# Patient Record
Sex: Male | Born: 1985 | Race: Black or African American | Hispanic: No | Smoking: Current every day smoker
Health system: Southern US, Community
[De-identification: ages and names within clinical notes are randomized; demographics above are authoritative.]

## PROBLEM LIST (undated history)

## (undated) DIAGNOSIS — F32A Depression, unspecified: Secondary | ICD-10-CM

## (undated) DIAGNOSIS — F329 Major depressive disorder, single episode, unspecified: Secondary | ICD-10-CM

## (undated) DIAGNOSIS — F419 Anxiety disorder, unspecified: Secondary | ICD-10-CM

---

## 2015-05-19 ENCOUNTER — Emergency Department (HOSPITAL_COMMUNITY)
Admission: EM | Admit: 2015-05-19 | Discharge: 2015-05-19 | Disposition: A | Discharge status: Other Acute Inpatient Hospital | Payer: Self-pay | Attending: Emergency Medicine | Admitting: Emergency Medicine

## 2015-05-19 ENCOUNTER — Inpatient Hospital Stay (HOSPITAL_COMMUNITY)
Admission: AD | Admit: 2015-05-19 | Discharge: 2015-05-21 | DRG: 885 | Disposition: A | Discharge status: Home / Self Care | Payer: No Typology Code available for payment source | Source: Intra-hospital | Attending: Psychiatry | Admitting: Psychiatry

## 2015-05-19 ENCOUNTER — Encounter (HOSPITAL_COMMUNITY): Payer: Self-pay

## 2015-05-19 ENCOUNTER — Encounter (HOSPITAL_COMMUNITY): Payer: Self-pay | Admitting: *Deleted

## 2015-05-19 DIAGNOSIS — G4700 Insomnia, unspecified: Secondary | ICD-10-CM | POA: Diagnosis present

## 2015-05-19 DIAGNOSIS — F1210 Cannabis abuse, uncomplicated: Secondary | ICD-10-CM | POA: Diagnosis present

## 2015-05-19 DIAGNOSIS — F17210 Nicotine dependence, cigarettes, uncomplicated: Secondary | ICD-10-CM | POA: Diagnosis present

## 2015-05-19 DIAGNOSIS — F411 Generalized anxiety disorder: Secondary | ICD-10-CM | POA: Diagnosis present

## 2015-05-19 DIAGNOSIS — R451 Restlessness and agitation: Secondary | ICD-10-CM | POA: Insufficient documentation

## 2015-05-19 DIAGNOSIS — F331 Major depressive disorder, recurrent, moderate: Secondary | ICD-10-CM | POA: Diagnosis not present

## 2015-05-19 DIAGNOSIS — F918 Other conduct disorders: Secondary | ICD-10-CM | POA: Insufficient documentation

## 2015-05-19 DIAGNOSIS — F29 Unspecified psychosis not due to a substance or known physiological condition: Secondary | ICD-10-CM | POA: Insufficient documentation

## 2015-05-19 LAB — COMPREHENSIVE METABOLIC PANEL
ALBUMIN: 4.7 g/dL (ref 3.5–5.0)
ALT: 26 U/L (ref 17–63)
ANION GAP: 14 (ref 5–15)
AST: 27 U/L (ref 15–41)
Alkaline Phosphatase: 50 U/L (ref 38–126)
BILIRUBIN TOTAL: 2 mg/dL — AB (ref 0.3–1.2)
BUN: 9 mg/dL (ref 6–20)
CO2: 22 mmol/L (ref 22–32)
Calcium: 9.2 mg/dL (ref 8.9–10.3)
Chloride: 102 mmol/L (ref 101–111)
Creatinine, Ser: 0.98 mg/dL (ref 0.61–1.24)
GFR calc Af Amer: >60 mL/min (ref >60)
GFR calc non Af Amer: >60 mL/min (ref >60)
GLUCOSE: 91 mg/dL (ref 65–99)
POTASSIUM: 3.3 mmol/L — AB (ref 3.5–5.1)
Sodium: 138 mmol/L (ref 135–145)
TOTAL PROTEIN: 7.6 g/dL (ref 6.5–8.1)

## 2015-05-19 LAB — I-STAT CHEM 8, ED
BUN: 8 mg/dL (ref 6–20)
CHLORIDE: 100 mmol/L — AB (ref 101–111)
CREATININE: 1 mg/dL (ref 0.61–1.24)
Calcium, Ion: 1.12 mmol/L (ref 1.12–1.23)
Glucose, Bld: 92 mg/dL (ref 65–99)
HEMATOCRIT: 51 % (ref 39.0–52.0)
Hemoglobin: 17.3 g/dL — ABNORMAL HIGH (ref 13.0–17.0)
POTASSIUM: 3.3 mmol/L — AB (ref 3.5–5.1)
Sodium: 140 mmol/L (ref 135–145)
TCO2: 23 mmol/L (ref 0–100)

## 2015-05-19 LAB — CBC WITH DIFFERENTIAL/PLATELET
BASOS PCT: 0 %
Basophils Absolute: 0 10*3/uL (ref 0.0–0.1)
EOS ABS: 0.1 10*3/uL (ref 0.0–0.7)
Eosinophils Relative: 2 %
HEMATOCRIT: 46 % (ref 39.0–52.0)
Hemoglobin: 15.3 g/dL (ref 13.0–17.0)
Lymphocytes Relative: 33 %
Lymphs Abs: 0.8 10*3/uL (ref 0.7–4.0)
MCH: 31 pg (ref 26.0–34.0)
MCHC: 33.3 g/dL (ref 30.0–36.0)
MCV: 93.3 fL (ref 78.0–100.0)
MONO ABS: 0.3 10*3/uL (ref 0.1–1.0)
MONOS PCT: 10 %
NEUTROS ABS: 1.4 10*3/uL — AB (ref 1.7–7.7)
Neutrophils Relative %: 55 %
Platelets: 188 10*3/uL (ref 150–400)
RBC: 4.93 MIL/uL (ref 4.22–5.81)
RDW: 11.4 % — AB (ref 11.5–15.5)
WBC: 2.5 10*3/uL — ABNORMAL LOW (ref 4.0–10.5)

## 2015-05-19 LAB — RAPID URINE DRUG SCREEN, HOSP PERFORMED
Amphetamines: NOT DETECTED
BARBITURATES: NOT DETECTED
BENZODIAZEPINES: NOT DETECTED
COCAINE: NOT DETECTED
OPIATES: NOT DETECTED
Tetrahydrocannabinol: POSITIVE — AB

## 2015-05-19 LAB — SALICYLATE LEVEL: Salicylate Lvl: <4 mg/dL (ref 2.8–30.0)

## 2015-05-19 LAB — ETHANOL: ALCOHOL ETHYL (B): 74 mg/dL — AB (ref <5)

## 2015-05-19 LAB — ACETAMINOPHEN LEVEL: Acetaminophen (Tylenol), Serum: <10 ug/mL — ABNORMAL LOW (ref 10–30)

## 2015-05-19 MED ORDER — AMMONIA AROMATIC IN INHA
RESPIRATORY_TRACT | Status: AC
  Filled 2015-05-19: qty 10

## 2015-05-19 MED ORDER — POTASSIUM CHLORIDE CRYS ER 10 MEQ PO TBCR
10.0000 meq | EXTENDED_RELEASE_TABLET | Freq: Two times a day (BID) | ORAL | Status: DC
  Administered 2015-05-20 – 2015-05-21 (×3): 10 meq via ORAL
  Filled 2015-05-19 (×9): qty 1

## 2015-05-19 MED ORDER — ALUM & MAG HYDROXIDE-SIMETH 200-200-20 MG/5ML PO SUSP: 30.0000 mL | ORAL | Status: DC | PRN

## 2015-05-19 MED ORDER — HYDROXYZINE HCL 25 MG PO TABS: 25.0000 mg | ORAL_TABLET | Freq: Three times a day (TID) | ORAL | Status: DC | PRN

## 2015-05-19 MED ORDER — ZIPRASIDONE MESYLATE 20 MG IM SOLR
20.0000 mg | Freq: Once | INTRAMUSCULAR | Status: AC
  Administered 2015-05-19: 20 mg via INTRAMUSCULAR
  Filled 2015-05-19: qty 20

## 2015-05-19 MED ORDER — SODIUM CHLORIDE 0.9 % IV BOLUS (SEPSIS): 1000.0000 mL | Freq: Once | INTRAVENOUS | Status: DC

## 2015-05-19 MED ORDER — NICOTINE 21 MG/24HR TD PT24
21.0000 mg | MEDICATED_PATCH | Freq: Every day | TRANSDERMAL | Status: DC
  Administered 2015-05-19 – 2015-05-21 (×3): 21 mg via TRANSDERMAL
  Filled 2015-05-19 (×6): qty 1

## 2015-05-19 MED ORDER — STERILE WATER FOR INJECTION IJ SOLN
INTRAMUSCULAR | Status: AC
  Administered 2015-05-19: 10 mL
  Filled 2015-05-19: qty 10

## 2015-05-19 MED ORDER — ACETAMINOPHEN 325 MG PO TABS: 650.0000 mg | ORAL_TABLET | Freq: Four times a day (QID) | ORAL | Status: DC | PRN

## 2015-05-19 MED ORDER — TRAZODONE HCL 50 MG PO TABS: 50.0000 mg | ORAL_TABLET | Freq: Every evening | ORAL | Status: DC | PRN

## 2015-05-19 MED ORDER — LORAZEPAM 2 MG/ML IJ SOLN
2.0000 mg | Freq: Once | INTRAMUSCULAR | Status: AC
  Administered 2015-05-19: 2 mg via INTRAMUSCULAR
  Filled 2015-05-19: qty 1

## 2015-05-19 MED ORDER — MAGNESIUM HYDROXIDE 400 MG/5ML PO SUSP: 30.0000 mL | Freq: Every day | ORAL | Status: DC | PRN

## 2015-05-19 NOTE — ED Notes (Signed)
Pt's visa, MC, [employer], ebt, and DL cards, 2 black phones and white ring placed in valuables envelope and given to security.  Key taped to yellow sheet and placed with pt's IVC papers.

## 2015-05-19 NOTE — ED Notes (Signed)
Pt is ambulatory in the room without difficulty.  Pt is alert and oriented x 4.

## 2015-05-19 NOTE — ED Notes (Signed)
Patient belongings are clothing and shoes in 1 patient belonging bag. Valuables have been locked up with security. Patient beloning bag is at nursing station across from rm 15. Patient and belongings have been wanded by security.

## 2015-05-19 NOTE — ED Notes (Addendum)
Pt reported to be laying in the road in front of [employer], when police approached he stood with hands in his pockets and refused to turn around, pt then stated he was suicidal and wanted the police to shoot him.  Pt repeatedly saying he does not like white people and he wants a black nurse. Pt cursing and yelling at staff refusing to answer any questions

## 2015-05-19 NOTE — Tx Team (Signed)
Initial Interdisciplinary Treatment Plan   PATIENT STRESSORS: Financial difficulties Substance abuse   PATIENT STRENGTHS: Average or above average intelligence General fund of knowledge Physical Health   PROBLEM LIST: Problem List/Patient Goals Date to be addressed Date deferred Reason deferred Estimated date of resolution  "I don't want to be here, I want to live" 05/19/2015     Suicidal ideation 05/19/2015     depression 05/19/2015                                          DISCHARGE CRITERIA:  Ability to meet basic life and health needs Improved stabilization in mood, thinking, and/or behavior Need for constant or close observation no longer present  PRELIMINARY DISCHARGE PLAN: Outpatient therapy Return to previous living arrangement  PATIENT/FAMIILY INVOLVEMENT: This treatment plan has been presented to and reviewed with the patient, Stephen Lowe, The patient and family have been given the opportunity to ask questions and make suggestions.  JEHU-APPIAH, Emileigh Kellett K 05/19/2015, 10:02 PM

## 2015-05-19 NOTE — ED Provider Notes (Signed)
Pt sleeping but arousable Awaiting evaluation Mild hypotension noted but otherwise stable   Zadie Rhine, MD 05/19/15 [phone]

## 2015-05-19 NOTE — BHH Counselor (Signed)
Patient has been accepted to [facility] 407-1 per Gretta Arab, RN, [facility]. Informed patients nurse of acceptance.   Davina Poke, LCSW Therapeutic Triage Specialist [facility] 05/19/2015 7:20 PM

## 2015-05-19 NOTE — ED Notes (Addendum)
Pt care assumed, pt resting comfortably.   Sitter at bedside.

## 2015-05-19 NOTE — ED Notes (Signed)
Pt's fiance reports that she has never seen him like this before.  She reports that they are staying at the shelter and that he could not get back in until 11:00p and that he went to the bar around 9:00:P.  She reports that he had been texting her while at the bar and everything seemed to be all right. She also reports that he is not a heavy drinker.

## 2015-05-19 NOTE — ED Notes (Addendum)
Telepsych done

## 2015-05-19 NOTE — ED Provider Notes (Signed)
Pt resting comfortably after given medications BP 101/72 mmHg  Pulse 80  Temp(Src) 97.8 F (36.6 C) (Oral)  Resp 16  SpO2 98% Awaiting evaluation Labs pending at this time   Zadie Rhine, MD 05/19/15 [phone]

## 2015-05-19 NOTE — ED Notes (Signed)
Pt up to the desk, ambulatory w/o difficulty, reporting that he wants to go home.  Pt is aware that he is under IVC and can not leave at this time.

## 2015-05-19 NOTE — ED Notes (Signed)
Report received from Janie Rambo RN. Pt. Alert and oriented in no distress denies SI, HI, AVH and pain.  Pt. Instructed to come to me with problems or concerns.Will continue to monitor for safety via security cameras and Q 15 minute checks. 

## 2015-05-19 NOTE — ED Provider Notes (Addendum)
CSN: [id number]     Arrival date & time 05/19/15  4098 History   First MD Initiated Contact with Patient 05/19/15 0559     Chief Complaint  Patient presents with  . Suicidal     (Consider location/radiation/quality/duration/timing/severity/associated sxs/prior Treatment) Patient is a 30 y.o. male presenting with mental health disorder. The history is provided by the police. The history is limited by the condition of the patient.  Mental Health Problem Presenting symptoms: aggressive behavior   Presenting symptoms: no paranoid behavior   Presenting symptoms comment:  Laid down in the street and wanted the police to shoot him Patient accompanied by:  Law enforcement Degree of incapacity (severity):  Severe Onset quality:  Unable to specify Timing:  Unable to specify Progression:  Unable to specify Context: alcohol use   Treatment compliance:  Untreated Relieved by:  Nothing Worsened by:  Nothing tried Ineffective treatments:  None tried Associated symptoms: no abdominal pain   Risk factors comment:  Unable   No past medical history on file. No past surgical history on file. No family history on file. Social History  Substance Use Topics  . Smoking status: Not on file  . Smokeless tobacco: Not on file  . Alcohol Use: Not on file    Review of Systems  Unable to perform ROS: Psychiatric disorder  Gastrointestinal: Negative for abdominal pain.  Psychiatric/Behavioral: Negative for paranoia.      Allergies  Review of patient's allergies indicates not on file.  Home Medications   Prior to Admission medications   Not on File   BP 126/91 mmHg  Pulse 98  Temp(Src) 97.8 F (36.6 C) (Oral)  Resp 20  SpO2 100% Physical Exam  Constitutional: He appears well-developed and well-nourished.  HENT:  Head: Normocephalic and atraumatic.  Mouth/Throat: Oropharynx is clear and moist. No oropharyngeal exudate.  Eyes: Pupils are equal, round, and reactive to light.  Injected  conjunctiva  Neck: Normal range of motion. Neck supple.  Cardiovascular: Normal rate and regular rhythm.   Pulmonary/Chest: Effort normal and breath sounds normal. No respiratory distress. He has no wheezes. He has no rales.  Abdominal: Soft. Bowel sounds are normal. There is no tenderness. There is no rebound and no guarding.  Musculoskeletal: Normal range of motion.  Neurological: He is alert. He has normal reflexes.  Skin: Skin is warm and dry.  Psychiatric: His affect is angry. His speech is rapid and/or pressured. He is agitated and aggressive.    ED Course  Procedures (including critical care time) Labs Review Labs Reviewed  CBC WITH DIFFERENTIAL/PLATELET  COMPREHENSIVE METABOLIC PANEL  ETHANOL  URINE RAPID DRUG SCREEN, HOSP PERFORMED  ACETAMINOPHEN LEVEL  SALICYLATE LEVEL  I-STAT CHEM 8, ED    Imaging Review No results found. I have personally reviewed and evaluated these images and lab results as part of my medical decision-making.   EKG Interpretation   Date/Time:  Sunday May 19 2015 06:53:12 EST Ventricular Rate:  85 PR Interval:  168 QRS Duration: 98 QT Interval:  366 QTC Calculation: 435 R Axis:   -6 Text Interpretation:  Sinus rhythm Confirmed by [facility]  MD, Tarshia Kot  ([id number]) on 05/19/2015 7:10:24 AM      MDM   Final diagnoses:  None   IVC by police   Signed out to [facility] pending results of labs  Medications  ziprasidone (GEODON) injection 20 mg (20 mg Intramuscular Given 05/19/15 0618)  sterile water (preservative free) injection (10 mLs  Given 05/19/15 0618)  LORazepam (ATIVAN)  injection 2 mg (2 mg Intramuscular Given 05/19/15 0629)   Will need dispo by TTS when awake, as under IVC by police    Tyjay Galindo, MD 05/19/15 0724  Annabeth Tortora, MD 05/19/15 [phone]

## 2015-05-19 NOTE — BH Assessment (Addendum)
Tele Assessment Note   Stephen Lowe is an 30 y.o. male. Pt presents under IVC after lying in road in front of [employer] and telling cop to shoot him. Pt was sleeping and was woken for teleassessment. He is cooperative and oriented x 4. Pt denies SI. He says that he fell down into the road early this am and then decided to tie his shoes. Pt says while tying his shoe, a cop came up and started hassling him. Pt reports cop pulled a gun, so pt says he told cop "to kill me. I want to die anyway." Pt denies hx of suicide attempts. He reports two prior inpatient psychiatric admissions at [facility] in [place] for "depression and stress." Pt currently denies depressive symptoms. He said he had just been kicked out of [employer] for spilling a beverage on his clothes. Pt sts he was at a bar last night celebrating that his fiancee said "yes" when he proposed. He denies HI and denies [facility]. No delusions noted. 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 Pt's BAL was 74. He reports he drank four mixed drinks and beer. Pt says he rarely drinks alcohol or smokes marijuana. His last use of marijuana was 05/12/15 when he "just hit it a couple of times." He denies depressive symptoms. Pt denies access to weapons. He says he has had outpatient MH treatment in the past, but he doesn't remember where or when. Pt sts he moved to [place] a few months ago.   Diagnosis: Major Depressive Disorder, Recurrent, Moderate  Past Medical History: History reviewed. No pertinent past medical history.  History reviewed. No pertinent past surgical history.  Family History: No family history on file.  Social History:  reports that he drinks alcohol. He reports that he uses illicit drugs. His tobacco history is not on file.  Additional Social History:  Alcohol / Drug Use Pain Medications: pt denies abuse - see PTA meds list Prescriptions: pt denies abuse - see PTA meds  list Over the Counter: pt denies abuse - see PTA meds list History of alcohol / drug use?: Yes Substance #1 Name of Substance 1: alcohol 1 - Age of First Use: teenager 1 - Frequency: pt sts doesn't drink often 1 - Last Use / Amount: 05/18/15 - 4 mixed drinks and beer Substance #2 Name of Substance 2: marijuana 2 - Age of First Use: teenager 2 - Frequency: pt states doesn't drink often 2 - Last Use / Amount: 05/12/15 - "I just hit it a couple of times"  CIWA: CIWA-Ar BP: 90/64 mmHg Pulse Rate: 64 COWS:    PATIENT STRENGTHS: (choose at least two) Capable of independent living Communication skills Physical Health  Allergies: Allergies no known allergies  Home Medications:  (Not in a hospital admission)  OB/GYN Status:  No LMP for male patient.  General Assessment Data Location of Assessment: [facility] ED TTS Assessment: In system Is this a Tele or Face-to-Face Assessment?: Tele Assessment Is this an Initial Assessment or a Re-assessment for this encounter?: Initial Assessment Marital status: Long term relationship Is patient pregnant?: No Pregnancy Status: No Living Arrangements: Other (Comment) ([employer] [facility]) Can pt return to current living arrangement?: Yes Admission Status: Involuntary Is patient capable of signing voluntary admission?: Yes Referral Source: Other (BIB [employer]) Insurance type: self pay     Crisis Care Plan Living Arrangements: Other (Comment) ([facility]) Name of Psychiatrist: none Name of  Therapist: none  Education Status Is patient currently in school?: No Highest grade of school patient has completed: 12  Risk to self with the past 6 months Suicidal Ideation: No Has patient been a risk to self within the past 6 months prior to admission? : Yes Suicidal Intent: No (pt denies) Has patient had any suicidal intent within the past 6 months prior to admission? : Yes Is patient at risk for suicide?: Yes Suicidal Plan?:  (pt denies) Has patient  had any suicidal plan within the past 6 months prior to admission? : Yes (pt laid in street and told cops to shoot him) Access to Means: Yes Specify Access to Suicidal Means: access to police, access to roads What has been your use of drugs/alcohol within the last 12 months?: pt reports rare use of alcohol and marijuana Previous Attempts/Gestures: No How many times?: 0 Other Self Harm Risks: none Triggers for Past Attempts:  (n/a) Intentional Self Injurious Behavior: None Family Suicide History: No Recent stressful life event(s): Other (Comment) (pt kicked out of [employer]) Persecutory voices/beliefs?: No Depression: No Depression Symptoms:  (n/a) Substance abuse history and/or treatment for substance abuse?: No Suicide prevention information given to non-admitted patients: Not applicable  Risk to Others within the past 6 months Homicidal Ideation: No Does patient have any lifetime risk of violence toward others beyond the six months prior to admission? : No Thoughts of Harm to Others: No Current Homicidal Intent: No Current Homicidal Plan: No Access to Homicidal Means: No Identified Victim: none History of harm to others?: No Assessment of Violence: None Noted Violent Behavior Description: pt denies hx violence Does patient have access to weapons?: No Criminal Charges Pending?: No Does patient have a court date: No Is patient on probation?: No  Psychosis Hallucinations: None noted Delusions: None noted  Mental Status Report Appearance/Hygiene: In scrubs, Unremarkable Eye Contact: Fair Motor Activity: Freedom of movement Speech: Logical/coherent Level of Consciousness: Drowsy Mood: Euthymic ("okay") Affect: Blunted, Appropriate to circumstance Anxiety Level: None Thought Processes: Relevant, Coherent Judgement: Unimpaired Orientation: Person, Time, Situation, Place Obsessive Compulsive Thoughts/Behaviors: None  Cognitive Functioning Concentration:  Normal Memory: Recent Intact, Remote Intact IQ: Average Insight: Fair Impulse Control: Poor Appetite: Good Sleep: No Change Total Hours of Sleep: 8 Vegetative Symptoms: None  ADLScreening [facility] Assessment Services) Patient's cognitive ability adequate to safely complete daily activities?: Yes Patient able to express need for assistance with ADLs?: Yes Independently performs ADLs?: Yes (appropriate for developmental age)  Prior Inpatient Therapy Prior Inpatient Therapy: Yes Prior Therapy Dates: unknown dates Prior Therapy Facilty/Provider(s): [facility] Reason for Treatment: depression and stress  Prior Outpatient Therapy Prior Outpatient Therapy: Yes Prior Therapy Dates: unknown Prior Therapy Facilty/Provider(s): unknown Does patient have an ACCT team?: No Does patient have Intensive In-House Services?  : No Does patient have [facility] services? : Unknown Does patient have P4CC services?: Unknown  ADL Screening (condition at time of admission) Patient's cognitive ability adequate to safely complete daily activities?: Yes Is the patient deaf or have difficulty hearing?: No Does the patient have difficulty seeing, even when wearing glasses/contacts?: No Does the patient have difficulty concentrating, remembering, or making decisions?: No Patient able to express need for assistance with ADLs?: Yes Does the patient have difficulty dressing or bathing?: No Independently performs ADLs?: Yes (appropriate for developmental age) Does the patient have difficulty walking or climbing stairs?: No Weakness of Legs: None Weakness of Arms/Hands: None  Home Assistive Devices/Equipment Home Assistive Devices/Equipment: None    Abuse/Neglect Assessment (Assessment  to be complete while patient is alone) Physical Abuse: Denies Verbal Abuse: Denies Sexual Abuse: Denies Exploitation of patient/patient's resources: Denies Self-Neglect: Denies     Merchant navy officer (For  Healthcare) Does patient have an advance directive?: No Would patient like information on creating an advanced directive?: No - patient declined information    Additional Information 1:1 In Past 12 Months?: No CIRT Risk: No Elopement Risk: No Does patient have medical clearance?: Yes     Disposition:  Disposition Initial Assessment Completed for this Encounter: Yes Disposition of Patient: Inpatient treatment program Type of inpatient treatment program: Adult (josephine onuoha NP recommends inpatient treatment)  Talley Kreiser P 05/19/2015 12:34 PM

## 2015-05-19 NOTE — ED Notes (Signed)
Pt ambulated to bathroom with assist, on the way back, started to sigh and kneel to floor, pt responds to sternal rub and amonia, assisted back to bed, continue to monitor, sitter at bedside

## 2015-05-19 NOTE — ED Notes (Signed)
Bed: WA15 Expected date:  Expected time:  Means of arrival:  Comments: 

## 2015-05-19 NOTE — ED Notes (Signed)
Pt gave permission for me to call contact "Wify" in cell phone [phone]. Left message. Pt unsure of allergies, describes reaction of severe muscle spasms with jaw twisting. Pt unable to state what medications he is allergic to or what hospital he was treated at or what he was treated for.

## 2015-05-19 NOTE — ED Notes (Signed)
On the phone 

## 2015-05-19 NOTE — ED Notes (Signed)
[facility] EDP made aware re pt's BP.

## 2015-05-19 NOTE — ED Notes (Signed)
Up on the phone 

## 2015-05-19 NOTE — ED Notes (Signed)
Pt arrived ambulatory to room escorted by [employer] and security in bilateral wrist handcuffs. While walking to the room, pt loudly yelling and cursing. Once in the room, pt becoming defensive, aggressive, and uncooperative with the staff. EDP aware of pt behavior, pt medicated, placed in restraints.

## 2015-05-19 NOTE — ED Provider Notes (Signed)
BP 90/64 mmHg  Pulse 64  Temp(Src) 98.2 F (36.8 C) (Axillary)  Resp 16  SpO2 98% Vitals improved Pt sleeping but arousable Awaiting psych evaluation   CRITICAL CARE Performed by: Joya Gaskins Total critical care time: 31 minutes Critical care time was exclusive of separately billable procedures and treating other patients. Critical care was necessary to treat or prevent imminent or life-threatening deterioration. Critical care was time spent personally by me on the following activities: development of treatment plan with patient and/or surrogate as well as nursing, discussions with consultants, evaluation of patient's response to treatment, examination of patient, obtaining history from patient or surrogate, ordering and performing treatments and interventions, ordering and review of laboratory studies, ordering and review of radiographic studies, pulse oximetry and re-evaluation of patient's condition.   Zadie Rhine, MD 05/19/15 1121

## 2015-05-19 NOTE — ED Notes (Signed)
Pt. Noted in room. No complaints or concerns voiced. No distress or abnormal behavior noted. Will continue to monitor with security cameras. Q 15 minute rounds continue. 

## 2015-05-19 NOTE — Progress Notes (Signed)
Patient ID: Stephen Lowe, male   DOB: 07/02/1985, 30 y.o.   MRN: [id number] Admission note: D:Patient is Involuntary admission in no acute distress for depression and suicidal ideation. Pt reports he [facility] for [facility] arrival. Pt then decided to go to the bar and had a few drinks. Pt reports he tripped on his shoe lace and fell on the street. Pt reports a cop came over and pointed a gun at him. Pt reports he got angry and told the cop to shoot him because he wanted to die anyway. Pt denies suicidal ideation on admission. Pt reports he was brought here against his will and wanted to leave. Pt advised on his admission status and discharge procedure. Pt agreed to talk to provider tomorrow.  A: Pt admitted to unit per protocol, skin assessment and belonging search done. Pt had tattoos on bilateral upper arms and old scars on left wrist. Consent signed by pt. Pt educated on therapeutic milieu rules. Pt was introduced to milieu by nursing staff. Fall risk safety plan explained to the patient. 15 minutes checks started for safety.  R: Pt was receptive to education. Writer offered support.

## 2015-05-19 NOTE — ED Notes (Signed)
[facility] EDP made aware of pt's present status.  Okayed to transfer pt to [facility] and will discontinue IVF boluses.

## 2015-05-19 NOTE — ED Notes (Signed)
Delay in lab draw,  Pt not cooperating 

## 2015-05-20 ENCOUNTER — Encounter (HOSPITAL_COMMUNITY): Payer: Self-pay | Admitting: Psychiatry

## 2015-05-20 DIAGNOSIS — F331 Major depressive disorder, recurrent, moderate: Principal | ICD-10-CM

## 2015-05-20 NOTE — Progress Notes (Signed)
D:Patient in his room on approach.  Patient states he had a good day.  Patient state she has had an increased appetite and states everything is going well.  Patient states he hopes to be discharged soon.  Patient has been pleasant and cooperative. Patient denies SI/HI and denies AVH.   A: Staff to monitor Q 15 mins for safety.  Encouragement and support offered.  No scheduled medications administered per orders. R: Patient remains safe on the unit.  Patient attended group tonight.  Patient visible on the unit.  Patient has no medications administered tonight.

## 2015-05-20 NOTE — H&P (Signed)
Psychiatric Admission Assessment Adult  Patient Identification: Stephen Lowe  MRN:  [id number]  Date of Evaluation:  05/20/2015  Chief Complaint: "I told a cop to kill me, I'm not afraid of dying anyway".  Principal Diagnosis: Major depressive disorder, recurrent, moderate ([place])  Diagnosis:   Patient Active Problem List   Diagnosis Date Noted  . Major depressive disorder, recurrent, moderate (HCC) [F33.1] 05/19/2015   History of Present Illness: Stephen Lowe is a 30 year old African-American male. Admitted to [facility] from the [facility] with complaints of telling a cop to shoot him. During this admission assessment, Stephen Lowe reports, "I went to the hospital the day prior. What happened was, I had few drinks earlier on, 5 mixed drinks & few bottles of beer because I was celebrating my engagement. Then, [place] [employer] [place] bus because my motor cycle was not running. It needed to be fixed. On my way to [place], I tripped & fell off the curb. I realize my shoes were untied, I bent down to tie my shoes. It happened that a cop was right there at the light watching me trying to get up. The next thing I know, he turned on his blue light, came towards me, pointed a gun at my face & said to put my hands up. I knew I was not doing anything wrong, I got mad & said to him, I'm not afraid to die. But, I did not say I wanted to die. I'm not suicidal or depressed. Once I put my hands up, the cop saw the old scars on my left wrist, he concluded I was suicidal. Those scars has been there since my teen years. It was something I did when I thought my mother did not care about me, my girlfriend left me also. I was feeling sad then".  Objective: Stephen Lowe has hx of inpatient psychiatric hospital at the [facility] under IVC last year.  Associated Signs/Symptoms:  Depression Symptoms:  Denies any symptoms of depression  (Hypo) Manic Symptoms:  Denies any hypomanic  symptoms  Anxiety Symptoms:  Currently experiencing high anxiety levels for being here  Psychotic Symptoms:  Denies any Psychotic symptoms  PTSD Symptoms: NA  Total Time spent with patient: 1 hour  Past Psychiatric History: Reports, has been hospitalized at the [facility] in [place], [place] in 2016  Is the patient at risk to self? No. , Patient denies any suicidal thoughts or plans. Has the patient been a risk to self in the past 6 months? No. (Patient denies) Has the patient been a risk to self within the distant past? Yes.    (Hx. Self mutilation) Is the patient a risk to others? No.  Has the patient been a risk to others in the past 6 months? No.  Has the patient been a risk to others within the distant past? No.   Prior Inpatient Therapy:Yes, [facility] in [place]  Prior Outpatient Therapy: None reported.  Alcohol Screening: 1. How often do you have a drink containing alcohol?: Monthly or less 2. How many drinks containing alcohol do you have on a typical day when you are drinking?: 3 or 4 3. How often do you have six or more drinks on one occasion?: Less than monthly Preliminary Score: 2 4. How often during the last year have you found that you were not able to stop drinking once you had started?: Never 5. How often during the last year have  you failed to do what was normally expected from you becasue of drinking?: Never 6. How often during the last year have you needed a first drink in the morning to get yourself going after a heavy drinking session?: Never 7. How often during the last year have you had a feeling of guilt of remorse after drinking?: Never 8. How often during the last year have you been unable to remember what happened the night before because you had been drinking?: Never 9. Have you or someone else been injured as a result of your drinking?: No 10. Has a relative or friend or a doctor or another health worker been concerned about your  drinking or suggested you cut down?: No Alcohol Use Disorder Identification Test Final Score (AUDIT): 3 Brief Intervention: AUDIT score less than 7 or less-screening does not suggest unhealthy drinking-brief intervention not indicated  Substance Abuse History in the last 12 months:  Yes.    Consequences of Substance Abuse: Medical Consequences:  Liver damage, Possible death by overdose Legal Consequences:  Arrests, jail time, Loss of driving privilege. Family Consequences:  Family discord, divorce and or separation.  Previous Psychotropic Medications: Yes (Patient says he has forgotten the names)  Psychological Evaluations: Yes   Past Medical History: History reviewed. No pertinent past medical history. History reviewed. No pertinent past surgical history.  Family History: History reviewed. No pertinent family history.  Family Psychiatric  History: Denies any familial hx of mental illness  Tobacco Screening: Yes, "I smoke cigarettes"  Social History:  History  Alcohol Use  . Yes     History  Drug Use  . Yes  . Special: Marijuana    Additional Social History:  Allergies:   Allergies  Allergen Reactions  . Other     Pt states he had an allergic reaction to a medication in a injection form but is unable to identity the name of the medication or what it was for. Reaction: locked jaw   Lab Results:  Results for orders placed or performed during the hospital encounter of 05/19/15 (from the past 48 hour(s))  CBC with Differential/Platelet     Status: Abnormal   Collection Time: 05/19/15  7:20 AM  Result Value Ref Range   WBC 2.5 (L) 4.0 - 10.5 K/uL   RBC 4.93 4.22 - 5.81 MIL/uL   Hemoglobin 15.3 13.0 - 17.0 g/dL   HCT 46.0 39.0 - 52.0 %   MCV 93.3 78.0 - 100.0 fL   MCH 31.0 26.0 - 34.0 pg   MCHC 33.3 30.0 - 36.0 g/dL   RDW 11.4 (L) 11.5 - 15.5 %   Platelets 188 150 - 400 K/uL   Neutrophils Relative % 55 %   Neutro Abs 1.4 (L) 1.7 - 7.7 K/uL   Lymphocytes Relative 33  %   Lymphs Abs 0.8 0.7 - 4.0 K/uL   Monocytes Relative 10 %   Monocytes Absolute 0.3 0.1 - 1.0 K/uL   Eosinophils Relative 2 %   Eosinophils Absolute 0.1 0.0 - 0.7 K/uL   Basophils Relative 0 %   Basophils Absolute 0.0 0.0 - 0.1 K/uL  Comprehensive metabolic panel     Status: Abnormal   Collection Time: 05/19/15  7:20 AM  Result Value Ref Range   Sodium 138 135 - 145 mmol/L   Potassium 3.3 (L) 3.5 - 5.1 mmol/L   Chloride 102 101 - 111 mmol/L   CO2 22 22 - 32 mmol/L   Glucose, Bld 91 65 - 99 mg/dL  BUN 9 6 - 20 mg/dL   Creatinine, Ser 0.98 0.61 - 1.24 mg/dL   Calcium 9.2 8.9 - 10.3 mg/dL   Total Protein 7.6 6.5 - 8.1 g/dL   Albumin 4.7 3.5 - 5.0 g/dL   AST 27 15 - 41 U/L   ALT 26 17 - 63 U/L   Alkaline Phosphatase 50 38 - 126 U/L   Total Bilirubin 2.0 (H) 0.3 - 1.2 mg/dL   GFR calc non Af Amer >60 >60 mL/min   GFR calc Af Amer >60 >60 mL/min    Comment: (NOTE) The eGFR has been calculated using the CKD EPI equation. This calculation has not been validated in all clinical situations. eGFR's persistently <60 mL/min signify possible Chronic Kidney Disease.    Anion gap 14 5 - 15  Ethanol     Status: Abnormal   Collection Time: 05/19/15  7:20 AM  Result Value Ref Range   Alcohol, Ethyl (B) 74 (H) <5 mg/dL    Comment:        LOWEST DETECTABLE LIMIT FOR SERUM ALCOHOL IS 5 mg/dL FOR MEDICAL PURPOSES ONLY   Acetaminophen level     Status: Abnormal   Collection Time: 05/19/15  7:20 AM  Result Value Ref Range   Acetaminophen (Tylenol), Serum <10 (L) 10 - 30 ug/mL    Comment:        THERAPEUTIC CONCENTRATIONS VARY SIGNIFICANTLY. A RANGE OF 10-30 ug/mL MAY BE AN EFFECTIVE CONCENTRATION FOR MANY PATIENTS. HOWEVER, SOME ARE BEST TREATED AT CONCENTRATIONS OUTSIDE THIS RANGE. ACETAMINOPHEN CONCENTRATIONS >150 ug/mL AT 4 HOURS AFTER INGESTION AND >50 ug/mL AT 12 HOURS AFTER INGESTION ARE OFTEN ASSOCIATED WITH TOXIC REACTIONS.   Salicylate level     Status: None    Collection Time: 05/19/15  7:20 AM  Result Value Ref Range   Salicylate Lvl <0.1 2.8 - 30.0 mg/dL  I-stat chem 8, ed     Status: Abnormal   Collection Time: 05/19/15  7:26 AM  Result Value Ref Range   Sodium 140 135 - 145 mmol/L   Potassium 3.3 (L) 3.5 - 5.1 mmol/L   Chloride 100 (L) 101 - 111 mmol/L   BUN 8 6 - 20 mg/dL   Creatinine, Ser 1.00 0.61 - 1.24 mg/dL   Glucose, Bld 92 65 - 99 mg/dL   Calcium, Ion 1.12 1.12 - 1.23 mmol/L   TCO2 23 0 - 100 mmol/L   Hemoglobin 17.3 (H) 13.0 - 17.0 g/dL   HCT 51.0 39.0 - 52.0 %  Urine rapid drug screen (hosp performed)     Status: Abnormal   Collection Time: 05/19/15 12:08 PM  Result Value Ref Range   Opiates NONE DETECTED NONE DETECTED   Cocaine NONE DETECTED NONE DETECTED   Benzodiazepines NONE DETECTED NONE DETECTED   Amphetamines NONE DETECTED NONE DETECTED   Tetrahydrocannabinol POSITIVE (A) NONE DETECTED   Barbiturates NONE DETECTED NONE DETECTED    Comment:        DRUG SCREEN FOR MEDICAL PURPOSES ONLY.  IF CONFIRMATION IS NEEDED FOR ANY PURPOSE, NOTIFY LAB WITHIN 5 DAYS.        LOWEST DETECTABLE LIMITS FOR URINE DRUG SCREEN Drug Class       Cutoff (ng/mL) Amphetamine      1000 Barbiturate      200 Benzodiazepine   093 Tricyclics       235 Opiates          300 Cocaine          300 THC  50    Blood Alcohol level:  Lab Results  Component Value Date   ETH 74* 18/56/3149   Metabolic Disorder Labs:  No results found for: HGBA1C, MPG No results found for: PROLACTIN No results found for: CHOL, TRIG, HDL, CHOLHDL, VLDL, LDLCALC  Current Medications: Current Facility-Administered Medications  Medication Dose Route Frequency Provider Last Rate Last Dose  . acetaminophen (TYLENOL) tablet 650 mg  650 mg Oral Q6H PRN Harriet Butte, NP      . alum & mag hydroxide-simeth (MAALOX/MYLANTA) 200-200-20 MG/5ML suspension 30 mL  30 mL Oral Q4H PRN Harriet Butte, NP      . hydrOXYzine (ATARAX/VISTARIL) tablet 25 mg  25  mg Oral TID PRN Harriet Butte, NP      . magnesium hydroxide (MILK OF MAGNESIA) suspension 30 mL  30 mL Oral Daily PRN Harriet Butte, NP      . nicotine (NICODERM CQ - dosed in mg/24 hours) patch 21 mg  21 mg Transdermal Daily Jenne Campus, MD   21 mg at 05/20/15 0809  . potassium chloride (K-DUR,KLOR-CON) CR tablet 10 mEq  10 mEq Oral BID Harriet Butte, NP   10 mEq at 05/20/15 7026  . traZODone (DESYREL) tablet 50 mg  50 mg Oral QHS PRN Harriet Butte, NP       PTA Medications: No prescriptions prior to admission   Musculoskeletal: Strength & Muscle Tone: within normal limits Gait & Station: normal Patient leans: N/A  Psychiatric Specialty Exam: Physical Exam  Constitutional: He is oriented to person, place, and time. He appears well-developed.  Hair in dreadlocs  HENT:  Head: Normocephalic.  Eyes: Pupils are equal, round, and reactive to light.  Neck: Normal range of motion.  Cardiovascular: Normal rate.   Respiratory: Effort normal.  GI: Soft.  Genitourinary:  Denies any issues in this area  Musculoskeletal: Normal range of motion.  Neurological: He is alert and oriented to person, place, and time.  Skin: Skin is warm.  Psychiatric: His speech is normal and behavior is normal. Judgment and thought content normal. His mood appears anxious. His affect is not angry, not blunt, not labile and not inappropriate. Cognition and memory are normal. He exhibits a depressed mood.    Review of Systems  Constitutional: Negative.   HENT: Negative.   Eyes: Negative.   Respiratory: Negative.   Cardiovascular: Negative.   Gastrointestinal: Negative.   Genitourinary: Negative.   Musculoskeletal: Negative.   Skin: Negative.   Neurological: Negative.   Endo/Heme/Allergies: Negative.   Psychiatric/Behavioral: Positive for substance abuse (Alcohol/THC abuse). Negative for depression (Denies feeling depressed), suicidal ideas, hallucinations and memory loss. The patient is not  nervous/anxious and does not have insomnia.     Blood pressure 136/72, pulse 89, temperature 97.8 F (36.6 C), temperature source Oral, resp. rate 18, height 5' 10"  (1.778 m), weight 68.493 kg (151 lb).Body mass index is 21.67 kg/(m^2).  General Appearance: Disheveled, hair in dread-locs, guarded  Eye Contact::  Fair  Speech:  Clear and Coherent  Volume:  Increased  Mood:  Angry that he is here  Affect:  Congruent and Flat  Thought Process:  Coherent and Logical  Orientation:  Full (Time, Place, and Person)  Thought Content:  Rumination, denies any hallucinations, delusional thoughts or paranoia  Suicidal Thoughts:  Denies   Homicidal Thoughts:  Denies  Memory:  Immediate;   Good Recent;   Good Remote;   Good  Judgement:  Fair  Insight:  Shallow  Psychomotor Activity:  Restlessness  Concentration:  Poor  Recall:  [place] of Knowledge:Fair  Language: Good  Akathisia:  No  Handed:  Right  AIMS (if indicated):     Assets:  Desire for Improvement  ADL's:  Intact  Cognition: WNL  Sleep:  Number of Hours: 6   Treatment Plan/Recommendations: 1. Admit for crisis management and stabilization, estimated length of stay 3-5 days.  2. Medication management to reduce current symptoms to base line and improve the patient's overall level of functioning; Hydroxyzine 25 mg for anxiety, Trazodone 50 mg for insomnia   3. Treat health problems as indicated; Kdur 20 meq for low potassium levels  4. Develop treatment plan to decrease risk of relapse upon discharge and the need for readmission.  5. Psycho-social education regarding relapse prevention and self care.  6. Health care follow up as needed for medical problems.  7. Review, reconcile, and reinstate any pertinent home medications for other health issues where appropriate. 8. Call for consults with hospitalist for any additional specialty patient care services as needed.  Observation Level/Precautions:  15 minute checks  Laboratory:   Per ED, BAL at 74, UDS + for Cannabis  Psychotherapy: Group sessions  Medications: Hydroxyzine 25 mg for anxiety, Kdur CR 20 meq, Trazodone 50 mg for insomnia  Consultations: As needed  Discharge Concerns:  Safety, Mood stability  Estimated LOS: 3-5 days  Other:  Admit to [address].   I certify that inpatient services furnished can reasonably be expected to improve the patient's condition.    Encarnacion Slates, NP, PMHNP-BC 2/20/201710:43 AM

## 2015-05-20 NOTE — Progress Notes (Signed)
Patient ID: Stephen Lowe, male   DOB: 10/15/1985, 30 y.o.   MRN: [id number]  DAR: Pt. Denies SI/HI and A/V Hallucinations. He reports sleep was good, appetite is good, energy level is normal, and concentration is good. He rates depression and anxiety 0/10. Patient does not report any pain or discomfort at this time. Support and encouragement provided to the patient. Scheduled nicotine patch provided to patient. Patient was irritable this morning but after talking to the MD and other staff appears to be in a better mood even seen smiling. Patient continues to be minimal with Clinical research associate. Writer encouraged patient to come to writer if patient needed anything he stated, "I won't." Patient appears to have better rapport with the MHT Jaquesha on the hall and is making his needs known to her. Q15 minute checks are maintained for safety.

## 2015-05-20 NOTE — Plan of Care (Signed)
Problem: Ineffective individual coping Goal: STG-Increase in ability to manage activities of daily living Outcome: Progressing Patient wearing regular clothing, not disheveled

## 2015-05-20 NOTE — BHH Suicide Risk Assessment (Signed)
[facility] Admission Suicide Risk Assessment   Nursing information obtained from:  Patient, Review of record Demographic factors:  Male, Low socioeconomic status, Unemployed Current Mental Status:  NA Loss Factors:  NA Historical Factors:  NA Risk Reduction Factors:  NA  Total Time spent with patient: 30 minutes Principal Problem: Major depressive disorder, recurrent, moderate (HCC) Diagnosis:   Patient Active Problem List   Diagnosis Date Noted  . Major depressive disorder, recurrent, moderate (HCC) [F33.1] 05/19/2015   Subjective Data: Pt states " I am fine , I do not feel depressed. I am all right."  Continued Clinical Symptoms:  Alcohol Use Disorder Identification Test Final Score (AUDIT): 3 The "Alcohol Use Disorders Identification Test", Guidelines for Use in Primary Care, Second Edition.  [employer] writer [facility]). Score between 0-7:  no or low risk or alcohol related problems. Score between 8-15:  moderate risk of alcohol related problems. Score between 16-19:  high risk of alcohol related problems. Score 20 or above:  warrants further diagnostic evaluation for alcohol dependence and treatment.   CLINICAL FACTORS:   Alcohol/Substance Abuse/Dependencies Previous Psychiatric Diagnoses and Treatments   Musculoskeletal: Strength & Muscle Tone: within normal limits Gait & Station: normal Patient leans: N/A  Psychiatric Specialty Exam: Review of Systems  Psychiatric/Behavioral: Negative for depression, suicidal ideas and hallucinations. The patient is not nervous/anxious.   All other systems reviewed and are negative.   Blood pressure 136/72, pulse 89, temperature 97.8 F (36.6 C), temperature source Oral, resp. rate 18, height  (1.778 m), weight 68.493 kg (151 lb).Body mass index is 21.67 kg/(m^2).  General Appearance: Fairly Groomed  Patent attorney::  Fair  Speech:  Normal Rate  Volume:  Normal  Mood:  Euthymic  Affect:  Congruent  Thought Process:  Coherent   Orientation:  Full (Time, Place, and Person)  Thought Content:  WDL  Suicidal Thoughts:  No  Homicidal Thoughts:  No  Memory:  Immediate;   Fair Recent;   Fair Remote;   Fair  Judgement:  Fair  Insight:  Fair  Psychomotor Activity:  Normal  Concentration:  Fair  Recall:  [employer] of Knowledge:Fair  Language: Fair  Akathisia:  No  Handed:  Right  AIMS (if indicated):     Assets:  Desire for Improvement  Sleep:  Number of Hours: 6  Cognition: WNL  ADL's:  Intact    COGNITIVE FEATURES THAT CONTRIBUTE TO RISK:  Polarized thinking    SUICIDE RISK:   Minimal: No identifiable suicidal ideation.  Patients presenting with no risk factors but with morbid ruminations; may be classified as minimal risk based on the severity of the depressive symptoms  PLAN OF CARE: Patient will benefit from inpatient treatment and stabilization.  Estimated length of stay is 5-7 days.  Patient denies any mood sx, psychosis , refuses to be started on any medications. Will observe patient on the unit. Case discussed with Aggie NP - please also see H&p.       I certify that inpatient services furnished can reasonably be expected to improve the patient's condition.   Amorina Doerr, MD 05/20/2015, 12:48 PM

## 2015-05-20 NOTE — [facility] Group Notes (Signed)
[facility] LCSW Aftercare Discharge Planning Group Note  05/20/2015 8:45 AM  Participation Quality: Alert, Appropriate and Oriented  Mood/Affect: Flat  Depression Rating: 0  Anxiety Rating: 0  Thoughts of Suicide: Pt denies SI/HI  Will you contract for safety? Yes  Current AVH: Pt denies  Plan for Discharge/Comments: Pt attended discharge planning group and actively participated in group. CSW discussed suicide prevention education with the group and encouraged them to discuss discharge planning and any relevant barriers. Pt is minimal during group and reported feeling "fine." Pt expressed desire to discharge.  Transportation Means: Pt reports access to transportation  Supports: No supports mentioned at this time  Chad Cordial, LCSWA 05/20/2015 4:10 PM

## 2015-05-20 NOTE — [facility] Group Notes (Signed)
[facility] LCSW Group Therapy  05/20/2015 1:15pm  Type of Therapy:  Group Therapy vercoming Obstacles  Participation Level:  Minimal  Participation Quality:  N/a- Pt slept  Affect:  Lethargic  Cognitive:  Appropriate and Oriented  Insight:  Developing/Improving and Improving  Engagement in Therapy:  Improving  Modes of Intervention:  Discussion, Exploration, Problem-solving and Support  Description of Group:   In this group patients will be encouraged to explore what they see as obstacles to their own wellness and recovery. They will be guided to discuss their thoughts, feelings, and behaviors related to these obstacles. The group will process together ways to cope with barriers, with attention given to specific choices patients can make. Each patient will be challenged to identify changes they are motivated to make in order to overcome their obstacles. This group will be process-oriented, with patients participating in exploration of their own experiences as well as giving and receiving support and challenge from other group members.  Summary of Patient Progress: Pt slept throughout the majority of the group discussion; participation was minimal.   Therapeutic Modalities:   Cognitive Behavioral Therapy Solution Focused Therapy Motivational Interviewing Relapse Prevention Therapy   Chad Cordial, LCSWA 05/20/2015 4:11 PM

## 2015-05-20 NOTE — Progress Notes (Addendum)
Writer introduced self to pt. Pt was guarded with a blunted affect. Pt denied any SI. Pt declined writer's offer for any foods and fluids. A nicotine patch was applied to his right arm. Pt had no concerns he wished for this writer to address at this time. Pt went to make a phone call upon the ending of our interaction. Pt remains safe at this time with q49min checks.

## 2015-05-21 MED ORDER — HYDROXYZINE HCL 25 MG PO TABS: 25.0000 mg | ORAL_TABLET | Freq: Three times a day (TID) | ORAL | Status: DC | PRN

## 2015-05-21 MED ORDER — NICOTINE 21 MG/24HR TD PT24: 21.0000 mg | MEDICATED_PATCH | Freq: Every day | TRANSDERMAL | Status: DC

## 2015-05-21 MED ORDER — TRAZODONE HCL 50 MG PO TABS: 50.0000 mg | ORAL_TABLET | Freq: Every evening | ORAL | Status: DC | PRN

## 2015-05-21 NOTE — Progress Notes (Signed)
  [facility] Adult Case Management Discharge Plan :  Will you be returning to the same living situation after discharge:  Yes,  Pt returning to friend's home At discharge, do you have transportation home?: Yes,  Pt provided with bus pass Do you have the ability to pay for your medications: Yes,  Pt provided with prescriptions and supplies  Release of information consent forms completed and in the chart;  Patient's signature needed at discharge.  Patient to Follow up at: Follow-up Information    Follow up with Pt declines referral for outpatient services.      Next level of care provider has access to [facility] Link:no  Safety Planning and Suicide Prevention discussed: Yes,  with Pt; declined family contact  Have you used any form of tobacco in the last 30 days? (Cigarettes, Smokeless Tobacco, Cigars, and/or Pipes): Yes  Has patient been referred to the Quitline?: Patient refused referral  Patient has been referred for addiction treatment: N/A  Elaina Hoops 05/21/2015, 3:44 PM

## 2015-05-21 NOTE — [facility] Group Notes (Signed)
[facility] Group Notes:  (Nursing/MHT/Case Management/Adjunct)  Date:  05/21/2015  Time:  11:52 AM  Type of Therapy:  Psychoeducational Skills  Participation Level:  Active  Participation Quality:  Monopolizing  Affect:  Irritable  Cognitive:  Alert  Insight:  Lacking  Engagement in Group:  Distracting and Monopolizing  Modes of Intervention:  Discussion, Education, Limit-setting and Support  Summary of Progress/Problems: Patient was irritable in group speaking about wanting to discharge. Stating why he was here. He left group early but did decide to come back in. Encouragement and support provided by staff and peers in the group. Patient received daily workbook.   Marzetta Board E 05/21/2015, 11:52 AM

## 2015-05-21 NOTE — Progress Notes (Signed)
Recreation Therapy Notes  Animal-Assisted Activity (AAA) Program Checklist/Progress Notes Patient Eligibility Criteria Checklist & Daily Group note for Rec Tx Intervention  Date: 02.21.2017 Time: 2:45pm Location: [address]   AAA/T Program Assumption of Risk Form signed by Patient/ or Parent Legal Guardian yes  Patient is free of allergies or sever asthma yes  Patient reports no fear of animals yes  Patient reports no history of cruelty to animals yes  Patient understands his/her participation is voluntary yes  Patient washes hands before animal contact yes  Patient washes hands after animal contact yes  Behavioral Response: Appropriate, Engaged   Education: Charity fundraiser, Appropriate Animal Interaction   Education Outcome: Acknowledges education.   Clinical Observations/Feedback: Patient appropriately engaged with therapy dog and peers in session. Patient pet therapy dog appropriately and shared stories about his pets at home with group.   Marykay Lex Bera Pinela, LRT/CTRS  Jacinto Keil L 05/21/2015 3:12 PM

## 2015-05-21 NOTE — [facility] Suicide Risk Assessment (Signed)
[facility] INPATIENT:  Family/Significant Other Suicide Prevention Education  Suicide Prevention Education:  Patient Refusal for Family/Significant Other Suicide Prevention Education: The patient Stephen Lowe has refused to provide written consent for family/significant other to be provided Family/Significant Other Suicide Prevention Education during admission and/or prior to discharge.  Physician notified.  Elaina Hoops 05/21/2015, 8:49 AM

## 2015-05-21 NOTE — [facility] Suicide Risk Assessment (Signed)
[facility] Discharge Suicide Risk Assessment   Principal Problem: Major depressive disorder, recurrent, moderate (HCC) Discharge Diagnoses:  Patient Active Problem List   Diagnosis Date Noted  . Major depressive disorder, recurrent, moderate (HCC) [F33.1] 05/19/2015    Total Time spent with patient: 30 minutes  Musculoskeletal: Strength & Muscle Tone: within normal limits Gait & Station: normal Patient leans: N/A  Psychiatric Specialty Exam: ROS  Blood pressure 130/79, pulse 99, temperature 98.5 F (36.9 C), temperature source Oral, resp. rate 16, height  (1.778 m), weight 151 lb (68.493 kg).Body mass index is 21.67 kg/(m^2).  General Appearance: Well Groomed  Patent attorney::  Good  Speech:  Normal Rate409  Volume:  Normal  Mood:  Euthymic  Affect:  Appropriate  Thought Process:  Linear  Orientation:  Full (Time, Place, and Person)  Thought Content:  no hallucinations, no delusions  Suicidal Thoughts:  No denies any suicidal or self injurious ideations   Homicidal Thoughts:  No denies any homicidal ideations  Memory:  recent and remote grossly intact   Judgement:  Other:  improved   Insight:  improved   Psychomotor Activity:  Normal  Concentration:  Good  Recall:  Good  Fund of Knowledge:Good  Language: Good  Akathisia:  Negative  Handed:  Right  AIMS (if indicated):     Assets:  Communication Skills Desire for Improvement Physical Health Resilience  Sleep:  Number of Hours: 6.75  Cognition: WNL  ADL's:  Intact   Mental Status Per Nursing Assessment::   On Admission:  NA  Demographic Factors:  30 year old male   Loss Factors:   Historical Factors: Denies history of depression, denies history of suicide attempts in the past   Risk Reduction Factors:   Sense of responsibility to family, Living with another person, especially a relative, Positive social support and Positive coping skills or problem solving skills  Continued Clinical Symptoms:  At this time  patient is alert and attentive, well related, pleasant, euthymic, full range of affect, no thought disorder, denies any suicidal ideations, denies any self injurious ideations, no self injurious ideations, no psychotic symptoms, future oriented, looking forward to seeing his GF again.  As reviewed with nursing staff, patient has been calm, pleasant, interactive on unit, and presenting euthymic . Patient states that event leading to admission  occurred while he was intoxicated and that his statements were made as he felt that authority's treatment of him was " biased because of my race ", and states " if I had been sober I would have not said anything like that , I do not want to die, I love life and have a lot to live for".  Of note, patient states he only drinks occasionally and denies pattern of alcohol dependence, not presenting with any symptoms of alcohol withdrawal at present- vitals stable Cognitive Features That Contribute To Risk:  No gross cognitive deficits noted upon discharge. Is alert , attentive, and oriented x 3   Suicide Risk:  Mild:  Suicidal ideation of limited frequency, intensity, duration, and specificity.  There are no identifiable plans, no associated intent, mild dysphoria and related symptoms, good self-control (both objective and subjective assessment), few other risk factors, and identifiable protective factors, including available and accessible social support.  Follow-up Information    Follow up with Pt declines referral for outpatient services.      Plan Of Care/Follow-up recommendations:  Activity:  as tolerated  Diet:  regular Tests:  NA Other:  See below  Patient  is requesting discharge and there are no current grounds for involuntary commitment  Plans to return home Encouraged to abstain from alcohol / illicit drugs Patient aware of low WBC- we reviewed- I have encouraged him to follow up with PCP after discharge  For ongoing monitorization.  Nehemiah Massed, MD 05/21/2015, 3:11 PM

## 2015-05-21 NOTE — [facility] Counselor (Signed)
Adult Comprehensive Assessment  Patient ID: Stephen Lowe, male   DOB: [birth year], 30 y.o.   MRN: [id number]  Information Source: Information source: Patient  Current Stressors:  Educational / Learning stressors: None reported Employment / Job issues: Pt is having trouble finding a job Family Relationships: Limited relationships with family; conflict with mother Surveyor, quantity / Lack of resources (include bankruptcy): No steady income at this point Housing / Lack of housing: Pt has been going between [employer] and a friend's home Physical health (include injuries & life threatening diseases): None reported Social relationships: Pt recently got engaged to his girlfriend of Substance abuse: Pt denies abuse, however was intoxicated at time of IVC Bereavement / Loss: None reported  Living/Environment/Situation:  Living Arrangements: Other (Comment) (lives between a friend's home and [facility]) Living conditions (as described by patient or guardian): transient but "okay" How long has patient lived in current situation?: a few months What is atmosphere in current home: Temporary  Family History:  Marital status:  (Pt is engaged to his girlfriend of 3 months) Does patient have children?: No  Childhood History:  By whom was/is the patient raised?: Both parents Description of patient's relationship with caregiver when they were a child: distant relationship with family; conflict with mother growing up Patient's description of current relationship with people who raised him/her: supportive relationship with father at times; continues to have conflict with mother How were you disciplined when you got in trouble as a child/adolescent?: Unknown Does patient have siblings?: Yes Number of Siblings: 7 Description of patient's current relationship with siblings: minimal relationships with siblings Did patient suffer any verbal/emotional/physical/sexual abuse as a child?: No Did  patient suffer from severe childhood neglect?: No Has patient ever been sexually abused/assaulted/raped as an adolescent or adult?: No Was the patient ever a victim of a crime or a disaster?: No Witnessed domestic violence?: No Has patient been effected by domestic violence as an adult?: No  Education:  Highest grade of school patient has completed: 11 Currently a Consulting civil engineer?: No Learning disability?: Yes What learning problems does patient have?: ADHD  Employment/Work Situation:   Employment situation: Unemployed Patient's job has been impacted by current illness: No What is the longest time patient has a held a job?: Unknown Where was the patient employed at that time?: Unknown Has patient ever been in the [employer]?: No Has patient ever served in combat?: No Did You Receive Any Psychiatric Treatment/Services While in Equities trader?: No Are There Guns or Other Weapons in Your Home?: No  Financial Resources:   Financial resources: No income Does patient have a Lawyer or guardian?: No  Alcohol/Substance Abuse:   What has been your use of drugs/alcohol within the last 12 months?: Pt reports occassional ETOH use and THC use If attempted suicide, did drugs/alcohol play a role in this?: No Alcohol/Substance Abuse Treatment Hx: Denies past history Has alcohol/substance abuse ever caused legal problems?: No  Social Support System:   Forensic psychologist System: Poor Describe Community Support System: Pt states his fiance is supportive but that is his only support Type of faith/religion: None reported How does patient's faith help to cope with current illness?: None reported  Leisure/Recreation:   Leisure and Hobbies: Pt did not state  Strengths/Needs:   What things does the patient do well?: Pt did not state In what areas does patient struggle / problems for patient: Pt did not state  Discharge Plan:   Does patient have access to transportation?: No Plan for  no access to transportation at discharge: public transit Will patient be returning to same living situation after discharge?: Yes (will stay with a friend) Currently receiving community mental health services: No If no, would patient like referral for services when discharged?: No Does patient have financial barriers related to discharge medications?: Yes Patient description of barriers related to discharge medications: no insurance and limited income  Summary/Recommendations:     Patient is a 30 year old male with a diagnosis of Major Depressive Disorder, recurrent, moderate. Pt presented to the hospital under IVC after he told a cop to shoot him. Pt reports primary trigger(s) for admission was being intoxicated and a misunderstanding with the officer. Patient will benefit from crisis stabilization, medication evaluation, group therapy and psycho education in addition to case management for discharge planning. At discharge it is recommended that Pt remain compliant with established discharge plan and continued treatment.    Elaina Hoops. 05/21/2015

## 2015-05-21 NOTE — Discharge Summary (Signed)
Physician Discharge Summary Note  Patient:  Stephen Lowe is an 30 y.o., male MRN:  [id number] DOB:  [birth year] Patient phone:  [phone] (home)  Patient address:   [address] [place] [address],  Total Time spent with patient: 30 minutes  Date of Admission:  05/19/2015 Date of Discharge: 05/21/2015  Reason for Admission:    depression   Principal Problem: Major depressive disorder, recurrent, moderate (HCC) Discharge Diagnoses: Patient Active Problem List   Diagnosis Date Noted  . Major depressive disorder, recurrent, moderate (HCC) [F33.1] 05/19/2015    Past Psychiatric History:  See above noted Past Medical History: History reviewed. No pertinent past medical history. History reviewed. No pertinent past surgical history. Family History: History reviewed. No pertinent family history. Family Psychiatric  History:  Denied Social History:  History  Alcohol Use  . Yes     History  Drug Use  . Yes  . Special: Marijuana    Social History   Social History  . Marital Status: Unknown    Spouse Name: N/A  . Number of Children: N/A  . Years of Education: N/A   Social History Main Topics  . Smoking status: Current Every Day Smoker -- 0.50 packs/day  . Smokeless tobacco: None  . Alcohol Use: Yes  . Drug Use: Yes    Special: Marijuana  . Sexual Activity: Yes   Other Topics Concern  . None   Social History Narrative   Hospital Course:   Stephen Lowe, a 30 year old male admitted to [facility] from the [facility] after telling a cop that he was not afraid to die and wanted to die.  Stephen Lowe was admitted for Major depressive disorder, recurrent, moderate (HCC) and crisis management.  He was treated with the following medications as listed below.  Stephen Lowe was discharged with current medication and was instructed on how to take medications as prescribed; (details listed below under Medication List).  Medical problems were identified and treated as needed.  Home  medications were restarted as appropriate.  Improvement was monitored by observation and Stephen Lowe daily report of symptom reduction.  Emotional and mental status was monitored by daily self-inventory reports completed by Stephen Lowe and clinical staff.         Stephen Lowe was evaluated by the treatment team for stability and plans for continued recovery upon discharge.  Stephen Lowe motivation was an integral factor for scheduling further treatment.  Employment, transportation, bed availability, health status, family support, and any pending legal issues were also considered during his hospital stay.  He was offered further treatment options upon discharge including but not limited to Residential, Intensive Outpatient, and Outpatient treatment.  Stephen Lowe will follow up with the services as listed below under Follow Up Information.     Upon completion of this admission the Stephen Lowe was both mentally and medically stable for discharge denying suicidal/homicidal ideation, auditory/visual/tactile hallucinations, delusional thoughts and paranoia.     Physical Findings: AIMS: Facial and Oral Movements Muscles of Facial Expression: None, normal Lips and Perioral Area: None, normal Jaw: None, normal Tongue: None, normal,Extremity Movements Upper (arms, wrists, hands, fingers): None, normal Lower (legs, knees, ankles, toes): None, normal, Trunk Movements Neck, shoulders, hips: None, normal, Overall Severity Severity of abnormal movements (highest score from questions above): None, normal Incapacitation due to abnormal movements: None, normal Patient's awareness of abnormal movements (rate only patient's report): No Awareness, Dental Status Current problems with teeth and/or dentures?: No Does patient usually wear dentures?: No  CIWA:  CIWA-Ar Total: 0 COWS:     Musculoskeletal: Strength & Muscle Tone: within normal limits Gait & Station: normal Patient leans:  N/A  Psychiatric Specialty Exam:  SEE MD SRA Review of Systems  All other systems reviewed and are negative.   Blood pressure 130/79, pulse 99, temperature 98.5 F (36.9 C), temperature source Oral, resp. rate 16, height  (1.778 m), weight 68.493 kg (151 lb).Body mass index is 21.67 kg/(m^2).  Have you used any form of tobacco in the last 30 days? (Cigarettes, Smokeless Tobacco, Cigars, and/or Pipes): Yes  Has this patient used any form of tobacco in the last 30 days? (Cigarettes, Smokeless Tobacco, Cigars, and/or Pipes) Yes, offered  Blood Alcohol level:  Lab Results  Component Value Date   ETH 74* 05/19/2015    Metabolic Disorder Labs:  No results found for: HGBA1C, MPG No results found for: PROLACTIN No results found for: CHOL, TRIG, HDL, CHOLHDL, VLDL, LDLCALC  See Psychiatric Specialty Exam and Suicide Risk Assessment completed by Attending Physician prior to discharge.  Discharge destination:  Home  Is patient on multiple antipsychotic therapies at discharge:  No   Has Patient had three or more failed trials of antipsychotic monotherapy by history:  No  Recommended Plan for Multiple Antipsychotic Therapies: NA     Medication List    TAKE these medications      Indication   hydrOXYzine 25 MG tablet  Commonly known as:  ATARAX/VISTARIL  Take 1 tablet (25 mg total) by mouth 3 (three) times daily as needed for anxiety (sleep).   Indication:  Anxiety Neurosis     nicotine 21 mg/24hr patch  Commonly known as:  NICODERM CQ - dosed in mg/24 hours  Place 1 patch (21 mg total) onto the skin daily.   Indication:  Nicotine Addiction     traZODone 50 MG tablet  Commonly known as:  DESYREL  Take 1 tablet (50 mg total) by mouth at bedtime as needed for sleep (May repeat x1).   Indication:  Trouble Sleeping       Follow-up Information    Follow up with Pt declines referral for outpatient services.      Follow-up recommendations:  Activity:  as tol Diet:  as  tol  Comments:  1.  Take all your medications as prescribed.              2.  Report any adverse side effects to outpatient provider.                       3.  Patient instructed to not use alcohol or illegal drugs while on prescription medicines.            4.  In the event of worsening symptoms, instructed patient to call 911, the crisis hotline or go to nearest emergency room for evaluation of symptoms.  Signed: Lindwood Qua, NP [facility] 05/21/2015, 1:23 PM  Patient seen, Suicide Assessment Completed.  Disposition Plan Reviewed

## 2015-05-21 NOTE — Tx Team (Addendum)
Interdisciplinary Treatment Plan Update (Adult) Date: 05/21/2015   Date: 05/21/2015 8:50 AM  Progress in Treatment:  Attending groups: Yes  Participating in groups: No Taking medication as prescribed: Yes  Tolerating medication: Yes  Family/Significant othe contact made: No, Pt declines Patient understands diagnosis: Pt minimizing events prior to admission Discussing patient identified problems/goals with staff: Yes  Medical problems stabilized or resolved: Yes  Denies suicidal/homicidal ideation: Yes Patient has not harmed self or Others: Yes   New problem(s) identified: None identified at this time.   Discharge Plan or Barriers: Pt will return to a friend's home and declines referral for services  Additional comments:  Patient and CSW reviewed pt's identified goals and treatment plan. Patient verbalized understanding and agreed to treatment plan. CSW reviewed [facility] "Discharge Process and Patient Involvement" Form. Pt verbalized understanding of information provided and signed form.   Reason for Continuation of Hospitalization:  Depression Suicidal ideation  Estimated length of stay: 0 days  Review of initial/current patient goals per problem list:   1.  Goal(s): Patient will participate in aftercare plan  Met:  Yes  Target date: 3-5 days from date of admission   As evidenced by: Patient will participate within aftercare plan AEB aftercare provider and housing plan at discharge being identified.   05/21/15: Pt will return to a friend's home and declines referral for services  2.  Goal (s): Patient will exhibit decreased depressive symptoms and suicidal ideations.  Met:  Yes  Target date: 3-5 days from date of admission   As evidenced by: Patient will utilize self rating of depression at 3 or below and demonstrate decreased signs of depression or be deemed stable for discharge by MD.  05/21/15: Pt rates depression at 0/10; denies SI Attendees:  Patient:    Family:     Physician: Dr. Parke Poisson, MD  05/21/2015 8:50 AM  Nursing: Lars Pinks, RN Case manager  05/21/2015 8:50 AM  Clinical Social Worker Peri Maris, [place] 05/21/2015 8:50 AM  Other: Tilden Fossa, [place] 05/21/2015 8:50 AM  Clinical: Darrol Angel RN; Gaylan Gerold, RN 05/21/2015 8:50 AM  Other: , RN Charge Nurse 05/21/2015 8:50 AM  Other: Hilda Lias, [place], [place] Work [phone]

## 2015-05-28 ENCOUNTER — Emergency Department (HOSPITAL_COMMUNITY): Payer: Self-pay

## 2015-05-28 ENCOUNTER — Emergency Department (HOSPITAL_COMMUNITY)
Admission: EM | Admit: 2015-05-28 | Discharge: 2015-05-29 | Disposition: A | Discharge status: Home / Self Care | Payer: Self-pay | Attending: Emergency Medicine | Admitting: Emergency Medicine

## 2015-05-28 ENCOUNTER — Encounter (HOSPITAL_COMMUNITY): Payer: Self-pay | Admitting: *Deleted

## 2015-05-28 DIAGNOSIS — F919 Conduct disorder, unspecified: Secondary | ICD-10-CM | POA: Insufficient documentation

## 2015-05-28 DIAGNOSIS — Z79899 Other long term (current) drug therapy: Secondary | ICD-10-CM | POA: Insufficient documentation

## 2015-05-28 DIAGNOSIS — F331 Major depressive disorder, recurrent, moderate: Secondary | ICD-10-CM | POA: Diagnosis present

## 2015-05-28 DIAGNOSIS — Z87828 Personal history of other (healed) physical injury and trauma: Secondary | ICD-10-CM | POA: Insufficient documentation

## 2015-05-28 DIAGNOSIS — F17200 Nicotine dependence, unspecified, uncomplicated: Secondary | ICD-10-CM | POA: Insufficient documentation

## 2015-05-28 DIAGNOSIS — F329 Major depressive disorder, single episode, unspecified: Secondary | ICD-10-CM | POA: Insufficient documentation

## 2015-05-28 DIAGNOSIS — M25532 Pain in left wrist: Secondary | ICD-10-CM | POA: Insufficient documentation

## 2015-05-28 DIAGNOSIS — F1210 Cannabis abuse, uncomplicated: Secondary | ICD-10-CM | POA: Insufficient documentation

## 2015-05-28 DIAGNOSIS — F332 Major depressive disorder, recurrent severe without psychotic features: Secondary | ICD-10-CM | POA: Diagnosis present

## 2015-05-28 LAB — RAPID URINE DRUG SCREEN, HOSP PERFORMED
AMPHETAMINES: NOT DETECTED
BENZODIAZEPINES: NOT DETECTED
Barbiturates: NOT DETECTED
COCAINE: NOT DETECTED
OPIATES: NOT DETECTED
TETRAHYDROCANNABINOL: POSITIVE — AB

## 2015-05-28 LAB — COMPREHENSIVE METABOLIC PANEL
ALT: 32 U/L (ref 17–63)
AST: 32 U/L (ref 15–41)
Albumin: 4.7 g/dL (ref 3.5–5.0)
Alkaline Phosphatase: 43 U/L (ref 38–126)
Anion gap: 9 (ref 5–15)
BUN: 12 mg/dL (ref 6–20)
CHLORIDE: 103 mmol/L (ref 101–111)
CO2: 24 mmol/L (ref 22–32)
CREATININE: 0.93 mg/dL (ref 0.61–1.24)
Calcium: 9.2 mg/dL (ref 8.9–10.3)
GFR calc non Af Amer: >60 mL/min (ref >60)
Glucose, Bld: 98 mg/dL (ref 65–99)
Potassium: 3.5 mmol/L (ref 3.5–5.1)
SODIUM: 136 mmol/L (ref 135–145)
Total Bilirubin: 1.4 mg/dL — ABNORMAL HIGH (ref 0.3–1.2)
Total Protein: 7.5 g/dL (ref 6.5–8.1)

## 2015-05-28 LAB — CBC
HCT: 43.9 % (ref 39.0–52.0)
HEMOGLOBIN: 15 g/dL (ref 13.0–17.0)
MCH: 31.8 pg (ref 26.0–34.0)
MCHC: 34.2 g/dL (ref 30.0–36.0)
MCV: 93 fL (ref 78.0–100.0)
Platelets: 188 10*3/uL (ref 150–400)
RBC: 4.72 MIL/uL (ref 4.22–5.81)
RDW: 11.4 % — ABNORMAL LOW (ref 11.5–15.5)
WBC: 6 10*3/uL (ref 4.0–10.5)

## 2015-05-28 LAB — SALICYLATE LEVEL

## 2015-05-28 LAB — ACETAMINOPHEN LEVEL: Acetaminophen (Tylenol), Serum: <10 ug/mL — ABNORMAL LOW (ref 10–30)

## 2015-05-28 LAB — ETHANOL: Alcohol, Ethyl (B): <5 mg/dL (ref <5)
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
## 2015-05-28 NOTE — ED Notes (Signed)
Patient has three bags of belongings in locker 26. Patient also has $696.21,wallet and cell phone and charger locked up with security. Patient refused to sign voucher.

## 2015-05-28 NOTE — ED Notes (Signed)
Per off duty officer Lorina Rabon, Upon discharge, current off duty officer should be noted that patient has active warrant for his arrest.

## 2015-05-28 NOTE — ED Provider Notes (Signed)
CSN: [id number]     Arrival date & time 05/28/15  1553 History   First MD Initiated Contact with Patient 05/28/15 1601     Chief Complaint  Patient presents with  . Suicidal     (Consider location/radiation/quality/duration/timing/severity/associated sxs/prior Treatment) HPI Comments: Patient is in police custody presents to the emergency department with chief complaint of suicidal ideation. He states that he wants to die. He states that he was eating his own stool in jail with the attempt of killing himself. He states "but I couldn't even do that in peace." When asked if you take same medications, he states "I wish that I had them so that I could overdose." When asked if he has any other medical problems, he states, "I wish that I did so that I could die." He states that he has no pain except for in his left wrist, which she injured during a fall several days ago. He is currently wearing a wrist splint.  The history is provided by the patient. No language interpreter was used.    History reviewed. No pertinent past medical history. History reviewed. No pertinent past surgical history. No family history on file. Social History  Substance Use Topics  . Smoking status: Current Every Day Smoker -- 0.50 packs/day  . Smokeless tobacco: None  . Alcohol Use: Yes    Review of Systems  Psychiatric/Behavioral: Positive for suicidal ideas and behavioral problems.  All other systems reviewed and are negative.     Allergies  Other  Home Medications   Prior to Admission medications   Medication Sig Start Date End Date Taking? Authorizing Provider  hydrOXYzine (ATARAX/VISTARIL) 25 MG tablet Take 1 tablet (25 mg total) by mouth 3 (three) times daily as needed for anxiety (sleep). 05/21/15   Adonis Brook, NP  nicotine (NICODERM CQ - DOSED IN MG/24 HOURS) 21 mg/24hr patch Place 1 patch (21 mg total) onto the skin daily. 05/21/15   Adonis Brook, NP  traZODone (DESYREL) 50 MG tablet Take 1  tablet (50 mg total) by mouth at bedtime as needed for sleep (May repeat x1). 05/21/15   Adonis Brook, NP   There were no vitals taken for this visit. Physical Exam  Constitutional: He is oriented to person, place, and time. He appears well-developed and well-nourished.  HENT:  Head: Normocephalic and atraumatic.  Eyes: Conjunctivae and EOM are normal. Pupils are equal, round, and reactive to light. Right eye exhibits no discharge. Left eye exhibits no discharge. No scleral icterus.  Neck: Normal range of motion. Neck supple. No JVD present.  Cardiovascular: Normal rate, regular rhythm and normal heart sounds.  Exam reveals no gallop and no friction rub.   No murmur heard. Pulmonary/Chest: Effort normal and breath sounds normal. No respiratory distress. He has no wheezes. He has no rales. He exhibits no tenderness.  Abdominal: Soft. He exhibits no distension and no mass. There is no tenderness. There is no rebound and no guarding.  Musculoskeletal: Normal range of motion. He exhibits no edema or tenderness.  Neurological: He is alert and oriented to person, place, and time.  Skin: Skin is warm and dry.  Psychiatric:  Depressed mood  Nursing note and vitals reviewed.   ED Course  Procedures (including critical care time) Results for orders placed or performed during the hospital encounter of 05/28/15  Comprehensive metabolic panel  Result Value Ref Range   Sodium 136 135 - 145 mmol/L   Potassium 3.5 3.5 - 5.1 mmol/L   Chloride 103 101 -  111 mmol/L   CO2 24 22 - 32 mmol/L   Glucose, Bld 98 65 - 99 mg/dL   BUN 12 6 - 20 mg/dL   Creatinine, Ser 9.81 0.61 - 1.24 mg/dL   Calcium 9.2 8.9 - 19.1 mg/dL   Total Protein 7.5 6.5 - 8.1 g/dL   Albumin 4.7 3.5 - 5.0 g/dL   AST 32 15 - 41 U/L   ALT 32 17 - 63 U/L   Alkaline Phosphatase 43 38 - 126 U/L   Total Bilirubin 1.4 (H) 0.3 - 1.2 mg/dL   GFR calc non Af Amer >60 >60 mL/min   GFR calc Af Amer >60 >60 mL/min   Anion gap 9 5 - 15   Ethanol (ETOH)  Result Value Ref Range   Alcohol, Ethyl (B) <5 <5 mg/dL  Salicylate level  Result Value Ref Range   Salicylate Lvl <4.0 2.8 - 30.0 mg/dL  Acetaminophen level  Result Value Ref Range   Acetaminophen (Tylenol), Serum <10 (L) 10 - 30 ug/mL  CBC  Result Value Ref Range   WBC 6.0 4.0 - 10.5 K/uL   RBC 4.72 4.22 - 5.81 MIL/uL   Hemoglobin 15.0 13.0 - 17.0 g/dL   HCT 47.8 29.5 - 62.1 %   MCV 93.0 78.0 - 100.0 fL   MCH 31.8 26.0 - 34.0 pg   MCHC 34.2 30.0 - 36.0 g/dL   RDW 30.8 (L) 65.7 - 84.6 %   Platelets 188 150 - 400 K/uL  Urine rapid drug screen (hosp performed) (Not at [facility])  Result Value Ref Range   Opiates NONE DETECTED NONE DETECTED   Cocaine NONE DETECTED NONE DETECTED   Benzodiazepines NONE DETECTED NONE DETECTED   Amphetamines NONE DETECTED NONE DETECTED   Tetrahydrocannabinol POSITIVE (A) NONE DETECTED   Barbiturates NONE DETECTED NONE DETECTED   No results found.  I have personally reviewed and evaluated these images and lab results as part of my medical decision-making.    MDM   Final diagnoses:  None    Patient with suicidal ideation. He is from jail. He is in police custody. Was found eating his own stool.  Will consult TTS.   Recommendation that patient be assessed by psychiatry in AM.  Move to [facility].    In police custody, NOT under IVC.   Roxy Horseman, PA-C 05/28/15 1808  Arby Barrette, MD 06/09/15 [phone]

## 2015-05-28 NOTE — ED Notes (Signed)
Per EMS, pt from jail, started to eat his own stool and reports he is suicidal.  Pt is not talking to staff at this time.

## 2015-05-28 NOTE — ED Notes (Signed)
Bed: WA06 Expected date:  Expected time:  Means of arrival:  Comments: EMS/73M/ingested feces/SI

## 2015-05-28 NOTE — ED Notes (Signed)
Pt yelling out into the hallway that he ate his own poop. Pt urinated in the corner of his room. [employer] remains at bedside.

## 2015-05-28 NOTE — ED Notes (Signed)
Patient hiding in corners watching staff. Patient does no respond if you ask him a question at this time. Encouragement and support provided and safety maintain. Q 15 min safety checks in place and patient monitor via camera.

## 2015-05-28 NOTE — ED Notes (Signed)
Pt appears to be despondent. He said that he is going to kill himself by starving himself to death because he has no reason to live. He complains of left wrist pain and said that he fell on it. He is wearing a wrist brace that he purchased at the drug store. He is pacing on the unit and refusing to eat or drink.

## 2015-05-29 DIAGNOSIS — F331 Major depressive disorder, recurrent, moderate: Secondary | ICD-10-CM | POA: Diagnosis present

## 2015-05-29 DIAGNOSIS — F332 Major depressive disorder, recurrent severe without psychotic features: Secondary | ICD-10-CM | POA: Diagnosis present

## 2015-05-29 MED ORDER — NICOTINE 21 MG/24HR TD PT24
21.0000 mg | MEDICATED_PATCH | Freq: Once | TRANSDERMAL | Status: DC
  Administered 2015-05-29: 21 mg via TRANSDERMAL
  Filled 2015-05-29: qty 1
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
## 2015-05-29 NOTE — ED Notes (Signed)
Declined d/c VS.  Contents of locker #26  And safe valubles returned and witnessed by patient.  Sent with [employer].  All f/u instructions reviewed and given to patient.  Escorted off unit by [employer] in NAD.
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
## 2015-05-29 NOTE — Discharge Instructions (Signed)
For your ongoing mental health needs, you are advised to follow up with [facility].  New and returning patients are seen at their walk-in clinic.  Walk-in hours are Monday - Friday from 8:00 am - 3:00 pm.  Walk-in patients are seen on a first come, first served basis.  Try to arrive as early as possible for he best chance of being seen the same day:       [facility]      [address]      [place], [place] [address]      [phone]  If you return to [facility], contact [facility] to find a provider in your community.  The number below also serves as a 24 hour crisis line:       [facility]      [phone]

## 2015-05-29 NOTE — BH Assessment (Signed)
[facility] Assessment Progress Note  Per Carolanne Grumbling, MD, this pt does not require psychiatric hospitalization at this time.  Pt is to be discharged from [facility] with outpatient referrals.  Pt is currently living in [place], but may be returning to [facility].  Discharge instructions provide contact information for both [facility] and for [facility].  Pt's nurse, Carlisle Beers, has been notified.  Doylene Canning, MA Triage Specialist [phone]

## 2015-08-11 ENCOUNTER — Emergency Department (HOSPITAL_COMMUNITY): Payer: Self-pay

## 2015-08-11 ENCOUNTER — Encounter (HOSPITAL_COMMUNITY): Payer: Self-pay | Admitting: *Deleted

## 2015-08-11 ENCOUNTER — Emergency Department (HOSPITAL_COMMUNITY)
Admission: EM | Admit: 2015-08-11 | Discharge: 2015-08-11 | Payer: Self-pay | Attending: Emergency Medicine | Admitting: Emergency Medicine

## 2015-08-11 DIAGNOSIS — F329 Major depressive disorder, single episode, unspecified: Secondary | ICD-10-CM | POA: Insufficient documentation

## 2015-08-11 DIAGNOSIS — R52 Pain, unspecified: Secondary | ICD-10-CM

## 2015-08-11 DIAGNOSIS — S3022XA Contusion of scrotum and testes, initial encounter: Secondary | ICD-10-CM | POA: Insufficient documentation

## 2015-08-11 DIAGNOSIS — Y939 Activity, unspecified: Secondary | ICD-10-CM | POA: Insufficient documentation

## 2015-08-11 DIAGNOSIS — R079 Chest pain, unspecified: Secondary | ICD-10-CM | POA: Insufficient documentation

## 2015-08-11 DIAGNOSIS — Y929 Unspecified place or not applicable: Secondary | ICD-10-CM | POA: Insufficient documentation

## 2015-08-11 DIAGNOSIS — F17200 Nicotine dependence, unspecified, uncomplicated: Secondary | ICD-10-CM | POA: Insufficient documentation

## 2015-08-11 DIAGNOSIS — Y999 Unspecified external cause status: Secondary | ICD-10-CM | POA: Insufficient documentation

## 2015-08-11 HISTORY — DX: Depression, unspecified: F32.A

## 2015-08-11 HISTORY — DX: Major depressive disorder, single episode, unspecified: F32.9

## 2015-08-11 HISTORY — DX: Anxiety disorder, unspecified: F41.9

## 2015-08-11 LAB — CBC
HCT: 44.4 % (ref 39.0–52.0)
HEMOGLOBIN: 15.5 g/dL (ref 13.0–17.0)
MCH: 32.2 pg (ref 26.0–34.0)
MCHC: 34.9 g/dL (ref 30.0–36.0)
MCV: 92.1 fL (ref 78.0–100.0)
Platelets: 187 10*3/uL (ref 150–400)
RBC: 4.82 MIL/uL (ref 4.22–5.81)
RDW: 12.2 % (ref 11.5–15.5)
WBC: 6.5 10*3/uL (ref 4.0–10.5)

## 2015-08-11 LAB — BASIC METABOLIC PANEL
ANION GAP: 9 (ref 5–15)
BUN: 16 mg/dL (ref 6–20)
CHLORIDE: 105 mmol/L (ref 101–111)
CO2: 22 mmol/L (ref 22–32)
Calcium: 9.8 mg/dL (ref 8.9–10.3)
Creatinine, Ser: 1.1 mg/dL (ref 0.61–1.24)
GFR calc non Af Amer: >60 mL/min (ref >60)
GLUCOSE: 100 mg/dL — AB (ref 65–99)
Potassium: 3.9 mmol/L (ref 3.5–5.1)
Sodium: 136 mmol/L (ref 135–145)

## 2015-08-11 LAB — I-STAT TROPONIN, ED
Troponin i, poc: 0 ng/mL (ref 0.00–0.08)
Troponin i, poc: 0 ng/mL (ref 0.00–0.08)

## 2015-08-11 MED ORDER — KETOROLAC TROMETHAMINE 30 MG/ML IJ SOLN
30.0000 mg | Freq: Once | INTRAMUSCULAR | Status: AC
  Administered 2015-08-11: 30 mg via INTRAVENOUS
  Filled 2015-08-11: qty 1

## 2015-08-11 NOTE — ED Notes (Signed)
Pt now focusing on chest pain, when asked about testicles, "they are ok, I just checked them" Writer noted not swelling or bruising, left testicle tender to touch.

## 2015-08-11 NOTE — ED Notes (Signed)
US at bedside
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
## 2015-08-11 NOTE — Discharge Instructions (Signed)
Ibuprofen 600 mg every 6 hours as needed for pain.  Return to the ER if symptoms significantly worsen or change.   Chest Wall Pain Chest wall pain is pain in or around the bones and muscles of your chest. Sometimes, an injury causes this pain. Sometimes, the cause may not be known. This pain may take several weeks or longer to get better. HOME CARE INSTRUCTIONS  Pay attention to any changes in your symptoms. Take these actions to help with your pain:   Rest as told by your health care provider.   Avoid activities that cause pain. These include any activities that use your chest muscles or your abdominal and side muscles to lift heavy items.   If directed, apply ice to the painful area:  Put ice in a plastic bag.  Place a towel between your skin and the bag.  Leave the ice on for 20 minutes, 2-3 times per day.  Take over-the-counter and prescription medicines only as told by your health care provider.  Do not use tobacco products, including cigarettes, chewing tobacco, and e-cigarettes. If you need help quitting, ask your health care provider.  Keep all follow-up visits as told by your health care provider. This is important. SEEK MEDICAL CARE IF:  You have a fever.  Your chest pain becomes worse.  You have new symptoms. SEEK IMMEDIATE MEDICAL CARE IF:  You have nausea or vomiting.  You feel sweaty or light-headed.  You have a cough with phlegm (sputum) or you cough up blood.  You develop shortness of breath.   This information is not intended to replace advice given to you by your health care provider. Make sure you discuss any questions you have with your health care provider.   Document Released: 03/16/2005 Document Revised: 12/05/2014 Document Reviewed: 06/11/2014 Elsevier Interactive Patient Education 2016 Elsevier Inc.  Contusion A contusion is a deep bruise. Contusions are the result of a blunt injury to tissues and muscle fibers under the skin. The injury  causes bleeding under the skin. The skin overlying the contusion may turn blue, purple, or yellow. Minor injuries will give you a painless contusion, but more severe contusions may stay painful and swollen for a few weeks.  CAUSES  This condition is usually caused by a blow, trauma, or direct force to an area of the body. SYMPTOMS  Symptoms of this condition include:  Swelling of the injured area.  Pain and tenderness in the injured area.  Discoloration. The area may have redness and then turn blue, purple, or yellow. DIAGNOSIS  This condition is diagnosed based on a physical exam and medical history. An X-ray, CT scan, or MRI may be needed to determine if there are any associated injuries, such as broken bones (fractures). TREATMENT  Specific treatment for this condition depends on what area of the body was injured. In general, the best treatment for a contusion is resting, icing, applying pressure to (compression), and elevating the injured area. This is often called the RICE strategy. Over-the-counter anti-inflammatory medicines may also be recommended for pain control.  HOME CARE INSTRUCTIONS   Rest the injured area.  If directed, apply ice to the injured area:  Put ice in a plastic bag.  Place a towel between your skin and the bag.  Leave the ice on for 20 minutes, 2-3 times per day.  If directed, apply light compression to the injured area using an elastic bandage. Make sure the bandage is not wrapped too tightly. Remove and reapply the  bandage as directed by your health care provider.  If possible, raise (elevate) the injured area above the level of your heart while you are sitting or lying down.  Take over-the-counter and prescription medicines only as told by your health care provider. SEEK MEDICAL CARE IF:  Your symptoms do not improve after several days of treatment.  Your symptoms get worse.  You have difficulty moving the injured area. SEEK IMMEDIATE MEDICAL CARE  IF:   You have severe pain.  You have numbness in a hand or foot.  Your hand or foot turns pale or cold.   This information is not intended to replace advice given to you by your health care provider. Make sure you discuss any questions you have with your health care provider.   Document Released: 12/24/2004 Document Revised: 12/05/2014 Document Reviewed: 08/01/2014 Elsevier Interactive Patient Education [employer]2016 Elsevier Inc.
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
## 2015-09-12 ENCOUNTER — Encounter (HOSPITAL_COMMUNITY): Payer: Self-pay | Admitting: Emergency Medicine

## 2015-09-12 ENCOUNTER — Emergency Department (HOSPITAL_COMMUNITY)
Admission: EM | Admit: 2015-09-12 | Discharge: 2015-09-12 | Disposition: A | Discharge status: Home / Self Care | Payer: Self-pay | Attending: Dermatology | Admitting: Dermatology

## 2015-09-12 DIAGNOSIS — R42 Dizziness and giddiness: Secondary | ICD-10-CM | POA: Insufficient documentation

## 2015-09-12 DIAGNOSIS — Z5321 Procedure and treatment not carried out due to patient leaving prior to being seen by health care provider: Secondary | ICD-10-CM | POA: Insufficient documentation

## 2015-09-12 NOTE — ED Notes (Signed)
Patient instructed that he will have to wait in lobby until we get an examination room in back open for MD to evaluate him.  Patient states that he feels he is at the wrong hospital. "this is a psych hospital and I am not crazy".  Patient states that he doesn't want to wait to go back to a room, "I am fine".  Patient left against medical advice.
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
